# Patient Record
Sex: Male | Born: 1990 | Race: Black or African American | Hispanic: No | Marital: Single | Smoking: Current every day smoker
Health system: Southern US, Community
[De-identification: ages and names within clinical notes are randomized; demographics above are authoritative.]

---

## 2018-02-09 ENCOUNTER — Other Ambulatory Visit: Payer: Self-pay

## 2018-02-09 ENCOUNTER — Encounter (HOSPITAL_COMMUNITY): Payer: Self-pay | Admitting: Emergency Medicine

## 2018-02-09 ENCOUNTER — Ambulatory Visit (HOSPITAL_COMMUNITY)
Admission: EM | Admit: 2018-02-09 | Discharge: 2018-02-09 | Disposition: A | Discharge status: Home / Self Care | Payer: Self-pay | Attending: Internal Medicine | Admitting: Internal Medicine

## 2018-02-09 DIAGNOSIS — H5789 Other specified disorders of eye and adnexa: Secondary | ICD-10-CM

## 2018-02-09 MED ORDER — ERYTHROMYCIN 5 MG/GM OP OINT: TOPICAL_OINTMENT | OPHTHALMIC | 0 refills | Status: DC

## 2018-02-09 MED ORDER — LORATADINE-PSEUDOEPHEDRINE ER 5-120 MG PO TB12: 1.0000 | ORAL_TABLET | Freq: Two times a day (BID) | ORAL | 0 refills | Status: DC

## 2018-02-09 NOTE — ED Provider Notes (Signed)
[facility]    CSN: [id number] Arrival date & time: 02/09/18  1718     History   Chief Complaint Chief Complaint  Patient presents with  . Conjunctivitis    HPI Larry Kelley is a 27 y.o. male.   27 year old male presents with eye irritation and swelling x1 day.  Patient states that he woke up yesterday and his eye was "like this.  Patient is acute in nature.  Condition is made better by nothing.  Condition is made worse by nothing.  Patient denies any treatment prior to arrival at this facility.  Patient describes his eyes itching.  Denies any visual loss, pain or history of allergies.  Patient denies any recent illnesses or trauma to the affected eye.     History reviewed. No pertinent past medical history.  There are no active problems to display for this patient.   History reviewed. No pertinent surgical history.     Home Medications    Prior to Admission medications   Not on File    Family History History reviewed. No pertinent family history.  Social History Social History   Tobacco Use  . Smoking status: Current Every Day Smoker    Packs/day: 0.50    Types: Cigarettes  . Smokeless tobacco: Never Used  Substance Use Topics  . Alcohol use: Yes    Frequency: Never  . Drug use: Never     Allergies   Patient has no known allergies.   Review of Systems Review of Systems  Eyes: Positive for redness. Negative for photophobia, pain and visual disturbance.       Swelling to eye lid     Physical Exam Triage Vital Signs ED Triage Vitals  Enc Vitals Group     BP 02/09/18 1803 131/86     Pulse Rate 02/09/18 1803 82     Resp 02/09/18 1803 16     Temp 02/09/18 1803 99.4 F (37.4 C)     Temp Source 02/09/18 1803 Oral     SpO2 02/09/18 1803 100 %     Weight --      Height --      Head Circumference --      Peak Flow --      Pain Score 02/09/18 1802 0     Pain Loc --      Pain Edu? --      Excl. in GC? --    No data  found.  Updated Vital Signs BP 131/86 (BP Location: Right Arm)   Pulse 82   Temp 99.4 F (37.4 C) (Oral)   Resp 16   SpO2 100%   Visual Acuity Right Eye Distance:   Left Eye Distance:   Bilateral Distance:    Right Eye Near:   Left Eye Near:    Bilateral Near:     Physical Exam  Constitutional: He is oriented to person, place, and time. He appears well-developed and well-nourished.  HENT:  Head: Normocephalic.  Eyes: Pupils are equal, round, and reactive to light.  Erythema noted to right thigh with swelling to upper lid.swelling noted under eye  Neck: Normal range of motion.  Pulmonary/Chest: Effort normal.  Musculoskeletal: Normal range of motion.  Neurological: He is alert and oriented to person, place, and time.  Skin: Skin is dry.  Psychiatric: He has a normal mood and affect.  Nursing note and vitals reviewed.    UC Treatments / Results  Labs (all labs ordered are listed, but only abnormal results  are displayed) Labs Reviewed - No data to display  EKG None  Radiology No results found.  Procedures Procedures (including critical care time)  Medications Ordered in UC Medications - No data to display  Initial Impression / Assessment and Plan / UC Course  I have reviewed the triage vital signs and the nursing notes.  Pertinent labs & imaging results that were available during my care of the patient were reviewed by me and considered in my medical decision making (see chart for details).      Final Clinical Impressions(s) / UC Diagnoses   Final diagnoses:  None   Discharge Instructions   None    ED Prescriptions    None     Controlled Substance Prescriptions [place] Controlled Substance Registry consulted? Not Applicable   Alene Mires, NP 02/09/18 1840

## 2018-02-09 NOTE — Discharge Instructions (Addendum)
If condition has not improved oin the next 48 hours or worsens please return

## 2018-02-09 NOTE — ED Triage Notes (Signed)
The patient presented to the [facility] with a complaint of right eye itching and swelling x 3 days. The patient reported using OTC visine.

## 2018-02-12 ENCOUNTER — Ambulatory Visit (HOSPITAL_COMMUNITY)
Admission: EM | Admit: 2018-02-12 | Discharge: 2018-02-12 | Disposition: A | Discharge status: Home / Self Care | Payer: Self-pay | Attending: Family Medicine | Admitting: Family Medicine

## 2018-02-12 ENCOUNTER — Encounter (HOSPITAL_COMMUNITY): Payer: Self-pay | Admitting: Emergency Medicine

## 2018-02-12 DIAGNOSIS — H1011 Acute atopic conjunctivitis, right eye: Secondary | ICD-10-CM

## 2018-02-12 DIAGNOSIS — H11421 Conjunctival edema, right eye: Secondary | ICD-10-CM

## 2018-02-12 MED ORDER — FLUORESCEIN SODIUM 1 MG OP STRP
ORAL_STRIP | OPHTHALMIC | Status: AC
  Filled 2018-02-12: qty 1

## 2018-02-12 MED ORDER — OLOPATADINE HCL 0.2 % OP SOLN: 1.0000 [drp] | Freq: Once | OPHTHALMIC | 0 refills | Status: AC

## 2018-02-12 MED ORDER — POLYETHYL GLYCOL-PROPYL GLYCOL 0.4-0.3 % OP GEL: 1.0000 "application " | Freq: Four times a day (QID) | OPHTHALMIC | 0 refills | Status: DC

## 2018-02-12 MED ORDER — CETIRIZINE HCL 10 MG PO TABS: 10.0000 mg | ORAL_TABLET | Freq: Every day | ORAL | 0 refills | Status: DC

## 2018-02-12 MED ORDER — TETRACAINE HCL 0.5 % OP SOLN
OPHTHALMIC | Status: AC
  Filled 2018-02-12: qty 4

## 2018-02-12 NOTE — ED Triage Notes (Signed)
PT has right eye redness, swelling, and discomfort. PT was seen here Saturday and given an ointment that has not helped.

## 2018-02-12 NOTE — Discharge Instructions (Addendum)
Use the systane eye gel at night before bed. You can use as needed but it will make your vision blurry. Always do the artificial tear gel last. You can do the allergy eye drops in the morning. Zyrtec daily or benadryl can be helpful to prevent further itching.  Make sure that you are doing the eye scrubs with baby shampoo and warm compresses.  Stop the ointment you have been using.  If your symptoms worsen or continue please follow up with an eye doctor.

## 2018-02-12 NOTE — ED Provider Notes (Signed)
[facility]    CSN: [id number] Arrival date & time: 02/12/18  1228     History   Chief Complaint Chief Complaint  Patient presents with  . Eye Problem    HPI Larry Kelley is a 27 y.o. male.   Patient is a 27 year old male presents for continued worsening right eye swelling and discomfort.  He was seen here 3 days ago and treated with erythromycin ointment.  He has been using this every 4 hours.  His symptoms have only worsened.  He has had some slight blurry vision.  He denies any associated fever, chills, body aches.    ROS per HPI      History reviewed. No pertinent past medical history.  There are no active problems to display for this patient.   History reviewed. No pertinent surgical history.     Home Medications    Prior to Admission medications   Medication Sig Start Date End Date Taking? Authorizing Provider  erythromycin ophthalmic ointment Place a 1/2 inch ribbon of ointment into the lower eyelid. 02/09/18  Yes Alene Mires, NP  cetirizine (ZYRTEC) 10 MG tablet Take 1 tablet (10 mg total) by mouth daily. 02/12/18   Muhammed Teutsch, Gloris Manchester A, NP  loratadine-pseudoephedrine (CLARITIN-D 12 HOUR) 5-120 MG tablet Take 1 tablet by mouth 2 (two) times daily. 02/09/18   Alene Mires, NP  Olopatadine HCl 0.2 % SOLN Apply 1 drop to eye once for 1 dose. 02/12/18 02/12/18  Janace Aris, NP  Polyethyl Glycol-Propyl Glycol (SYSTANE) 0.4-0.3 % GEL ophthalmic gel Place 1 application into both eyes 4 (four) times daily. 02/12/18   Janace Aris, NP    Family History No family history on file.  Social History Social History   Tobacco Use  . Smoking status: Current Every Day Smoker    Packs/day: 0.50    Types: Cigarettes  . Smokeless tobacco: Never Used  Substance Use Topics  . Alcohol use: Yes    Frequency: Never  . Drug use: Never     Allergies   Patient has no known allergies.   Review of Systems Review of Systems   Physical  Exam Triage Vital Signs ED Triage Vitals  Enc Vitals Group     BP 02/12/18 1243 116/76     Pulse Rate 02/12/18 1243 87     Resp 02/12/18 1243 18     Temp 02/12/18 1243 98.6 F (37 C)     Temp Source 02/12/18 1243 Oral     SpO2 02/12/18 1243 97 %     Weight --      Height --      Head Circumference --      Peak Flow --      Pain Score 02/12/18 1247 3     Pain Loc --      Pain Edu? --      Excl. in GC? --    No data found.  Updated Vital Signs BP 116/76 (BP Location: Left Arm)   Pulse 87   Temp 98.6 F (37 C) (Oral)   Resp 18   SpO2 97%   Visual Acuity Right Eye Distance:   Left Eye Distance:   Bilateral Distance:    Right Eye Near: R Near: 20/40 without corrective lens Left Eye Near:  L Near: 20/25 without corrective lens Bilateral Near:  20/25 without corrective lens  Physical Exam  Constitutional: He is oriented to person, place, and time. He appears well-developed and well-nourished.  Very pleasant.  Non toxic or ill appearing.   HENT:  Head: Normocephalic and atraumatic.  Eyes: Pupils are equal, round, and reactive to light. EOM are normal.  Chemosis of the right conjunctiva. Scleral injection. Upper lid swelling with erythema. mildly tender to palpation. Tearing from the eye. Some swelling under the eye.   Mild scleral injection to the left eye.   Neck: Normal range of motion.  Pulmonary/Chest: Effort normal.  Musculoskeletal: Normal range of motion.  Neurological: He is alert and oriented to person, place, and time.  Skin: Skin is warm and dry.  Psychiatric: He has a normal mood and affect.  Nursing note and vitals reviewed.      UC Treatments / Results  Labs (all labs ordered are listed, but only abnormal results are displayed) Labs Reviewed - No data to display  EKG None  Radiology No results found.  Procedures Procedures (including critical care time)  Medications Ordered in UC Medications - No data to display  Initial Impression /  Assessment and Plan / UC Course  I have reviewed the triage vital signs and the nursing notes.  Pertinent labs & imaging results that were available during my care of the patient were reviewed by me and considered in my medical decision making (see chart for details).     Chemosis of the right eye likely due to allergic conjunctivitis.  Told to stop the abx ointment We will start systane  and allergy eye drops(pataday) Zyrtec or benadryl daily  Lid scrubs and warm compresses Contact given for Optho if symptoms persist.    Final Clinical Impressions(s) / UC Diagnoses   Final diagnoses:  Chemosis of right conjunctiva  Allergic conjunctivitis of right eye     Discharge Instructions     Use the systane eye gel at night before bed. You can use as needed but it will make your vision blurry. Always do the artificial tear gel last. You can do the allergy eye drops in the morning. Zyrtec daily or benadryl can be helpful to prevent further itching.  Make sure that you are doing the eye scrubs with baby shampoo and warm compresses.  Stop the ointment you have been using.  If your symptoms worsen or continue please follow up with an eye doctor.       ED Prescriptions    Medication Sig Dispense Auth. Provider   Polyethyl Glycol-Propyl Glycol (SYSTANE) 0.4-0.3 % GEL ophthalmic gel Place 1 application into both eyes 4 (four) times daily. 1 Bottle Janayla Marik A, NP   Olopatadine HCl 0.2 % SOLN Apply 1 drop to eye once for 1 dose. 1 Bottle Alaisa Moffitt A, NP   cetirizine (ZYRTEC) 10 MG tablet Take 1 tablet (10 mg total) by mouth daily. 30 tablet Dahlia Byes A, NP     Controlled Substance Prescriptions [place] Controlled Substance Registry consulted? Not Applicable   Janace Aris, NP 02/12/18 1553

## 2018-11-06 ENCOUNTER — Other Ambulatory Visit: Payer: Self-pay

## 2018-11-06 ENCOUNTER — Ambulatory Visit (HOSPITAL_COMMUNITY)
Admission: EM | Admit: 2018-11-06 | Discharge: 2018-11-06 | Disposition: A | Discharge status: Home / Self Care | Payer: Self-pay | Attending: Family Medicine | Admitting: Family Medicine

## 2018-11-06 ENCOUNTER — Encounter (HOSPITAL_COMMUNITY): Payer: Self-pay

## 2018-11-06 DIAGNOSIS — T148XXA Other injury of unspecified body region, initial encounter: Secondary | ICD-10-CM

## 2018-11-06 MED ORDER — TETANUS-DIPHTH-ACELL PERTUSSIS 5-2.5-18.5 LF-MCG/0.5 IM SUSP
0.5000 mL | Freq: Once | INTRAMUSCULAR | Status: AC
  Administered 2018-11-06: 0.5 mL via INTRAMUSCULAR

## 2018-11-06 MED ORDER — TETANUS-DIPHTH-ACELL PERTUSSIS 5-2.5-18.5 LF-MCG/0.5 IM SUSP
INTRAMUSCULAR | Status: AC
  Filled 2018-11-06: qty 0.5

## 2018-11-06 MED ORDER — CEPHALEXIN 500 MG PO CAPS: 500.0000 mg | ORAL_CAPSULE | Freq: Four times a day (QID) | ORAL | 0 refills | Status: DC

## 2018-11-06 NOTE — Discharge Instructions (Signed)
We updated your tetanus today please keep clean and dry Keep foot elevated and may apply ice to help with any swelling If you start to develop any redness swelling or drainage please take Keflex 4 times daily for 5 days If symptoms are not resolving with use of antibiotic please follow-up in person

## 2018-11-06 NOTE — ED Triage Notes (Signed)
Pt stepped on a rusty nail. ( left foot).  Pt needs Tdap. This happened yesterday.

## 2018-11-06 NOTE — ED Provider Notes (Signed)
[facility]    CSN: [id number] Arrival date & time: 11/06/18  1002      History   Chief Complaint Chief Complaint  Patient presents with  . Foot Pain    HPI Larry Kelley is a 28 y.o. male no significant past medical history presenting today for evaluation of a puncture wound.  Patient stepped on a rusty nail yesterday.  Penetrated his foot slightly after going through flip-flop.  Notes that nail was old and resting.  He denies concern for underlying fracture.  Has some soreness around wound, but otherwise without difficulty ambulating.  Denies fevers.  Tetanus out of date.  Nail was removed from shoe and was fully intact.  HPI  History reviewed. No pertinent past medical history.  There are no active problems to display for this patient.   History reviewed. No pertinent surgical history.     Home Medications    Prior to Admission medications   Medication Sig Start Date End Date Taking? Authorizing Provider  cephALEXin (KEFLEX) 500 MG capsule Take 1 capsule (500 mg total) by mouth 4 (four) times daily. 11/06/18   Wieters, Hallie C, PA-C  cetirizine (ZYRTEC) 10 MG tablet Take 1 tablet (10 mg total) by mouth daily. 02/12/18   Loura Halt A, NP  erythromycin ophthalmic ointment Place a 1/2 inch ribbon of ointment into the lower eyelid. 02/09/18   Jacqualine Mau, NP  loratadine-pseudoephedrine (CLARITIN-D 12 HOUR) 5-120 MG tablet Take 1 tablet by mouth 2 (two) times daily. 02/09/18   Jacqualine Mau, NP  Polyethyl Glycol-Propyl Glycol (SYSTANE) 0.4-0.3 % GEL ophthalmic gel Place 1 application into both eyes 4 (four) times daily. 02/12/18   Orvan July, NP    Family History Family History  Problem Relation Age of Onset  . Healthy Mother   . Healthy Father     Social History Social History   Tobacco Use  . Smoking status: Current Every Day Smoker    Packs/day: 0.50    Types: Cigarettes  . Smokeless tobacco: Never Used  Substance Use Topics   . Alcohol use: Yes    Frequency: Never  . Drug use: Never     Allergies   Patient has no known allergies.   Review of Systems Review of Systems  Constitutional: Negative for fatigue and fever.  Eyes: Negative for redness, itching and visual disturbance.  Respiratory: Negative for shortness of breath.   Cardiovascular: Negative for chest pain and leg swelling.  Gastrointestinal: Negative for nausea and vomiting.  Musculoskeletal: Negative for arthralgias and myalgias.  Skin: Positive for wound. Negative for color change and rash.  Neurological: Negative for dizziness, syncope, weakness, light-headedness and headaches.     Physical Exam Triage Vital Signs ED Triage Vitals  Enc Vitals Group     BP 11/06/18 1036 131/68     Pulse Rate 11/06/18 1036 88     Resp 11/06/18 1036 18     Temp 11/06/18 1036 98.8 F (37.1 C)     Temp src --      SpO2 11/06/18 1036 100 %     Weight 11/06/18 1035 140 lb (63.5 kg)     Height --      Head Circumference --      Peak Flow --      Pain Score 11/06/18 1035 3     Pain Loc --      Pain Edu? --      Excl. in [place]? --    No data  found.  Updated Vital Signs BP 131/68 (BP Location: Right Arm)   Pulse 88   Temp 98.8 F (37.1 C)   Resp 18   Wt 140 lb (63.5 kg)   SpO2 100%   Visual Acuity Right Eye Distance:   Left Eye Distance:   Bilateral Distance:    Right Eye Near:   Left Eye Near:    Bilateral Near:     Physical Exam Vitals signs and nursing note reviewed.  Constitutional:      Appearance: He is well-developed.     Comments: No acute distress  HENT:     Head: Normocephalic and atraumatic.     Nose: Nose normal.  Eyes:     Conjunctiva/sclera: Conjunctivae normal.  Neck:     Musculoskeletal: Neck supple.  Cardiovascular:     Rate and Rhythm: Normal rate.  Pulmonary:     Effort: Pulmonary effort is normal. No respiratory distress.  Abdominal:     General: There is no distension.  Musculoskeletal: Normal range of  motion.  Skin:    General: Skin is warm and dry.     Comments: Plantar surface of left foot near heel with puncture wound, no surrounding erythema or drainage noted, tender to touch directly about wound, but otherwise nontender to palpation  Neurological:     Mental Status: He is alert and oriented to person, place, and time.      UC Treatments / Results  Labs (all labs ordered are listed, but only abnormal results are displayed) Labs Reviewed - No data to display  EKG   Radiology No results found.  Procedures Procedures (including critical care time)  Medications Ordered in UC Medications  Tdap (BOOSTRIX) injection 0.5 mL (has no administration in time range)    Initial Impression / Assessment and Plan / UC Course  I have reviewed the triage vital signs and the nursing notes.  Pertinent labs & imaging results that were available during my care of the patient were reviewed by me and considered in my medical decision making (see chart for details).     We will update tetanus, Keflex if developing signs of infection, keep area clean and dry, deferring imaging for now.  Most likely soft tissue injury.Discussed strict return precautions. Patient verbalized understanding and is agreeable with plan.  Final Clinical Impressions(s) / UC Diagnoses   Final diagnoses:  Puncture wound     Discharge Instructions     We updated your tetanus today please keep clean and dry Keep foot elevated and may apply ice to help with any swelling If you start to develop any redness swelling or drainage please take Keflex 4 times daily for 5 days If symptoms are not resolving with use of antibiotic please follow-up in person   ED Prescriptions    Medication Sig Dispense Auth. Provider   cephALEXin (KEFLEX) 500 MG capsule Take 1 capsule (500 mg total) by mouth 4 (four) times daily. 20 capsule Wieters, Hallie C, PA-C     Controlled Substance Prescriptions [place] Controlled Substance  Registry consulted? Not Applicable   Lew DawesWieters, Hallie C, [place]PA-C 11/06/18 1058

## 2019-01-27 ENCOUNTER — Other Ambulatory Visit: Payer: Self-pay

## 2019-01-27 DIAGNOSIS — Z20822 Contact with and (suspected) exposure to covid-19: Secondary | ICD-10-CM

## 2019-01-29 LAB — NOVEL CORONAVIRUS, NAA: SARS-CoV-2, NAA: NOT DETECTED

## 2019-12-15 ENCOUNTER — Ambulatory Visit (HOSPITAL_COMMUNITY)
Admission: EM | Admit: 2019-12-15 | Discharge: 2019-12-15 | Disposition: A | Discharge status: Home / Self Care | Payer: Self-pay | Attending: Family Medicine | Admitting: Family Medicine

## 2019-12-15 ENCOUNTER — Other Ambulatory Visit: Payer: Self-pay

## 2019-12-15 ENCOUNTER — Ambulatory Visit (INDEPENDENT_AMBULATORY_CARE_PROVIDER_SITE_OTHER): Payer: Self-pay

## 2019-12-15 ENCOUNTER — Encounter (HOSPITAL_COMMUNITY): Payer: Self-pay

## 2019-12-15 DIAGNOSIS — M25642 Stiffness of left hand, not elsewhere classified: Secondary | ICD-10-CM

## 2019-12-15 DIAGNOSIS — S6992XA Unspecified injury of left wrist, hand and finger(s), initial encounter: Secondary | ICD-10-CM

## 2019-12-15 DIAGNOSIS — W19XXXA Unspecified fall, initial encounter: Secondary | ICD-10-CM

## 2019-12-15 DIAGNOSIS — Y92009 Unspecified place in unspecified non-institutional (private) residence as the place of occurrence of the external cause: Secondary | ICD-10-CM

## 2019-12-15 MED ORDER — HYDROCODONE-ACETAMINOPHEN 5-325 MG PO TABS: 1.0000 | ORAL_TABLET | Freq: Four times a day (QID) | ORAL | 0 refills | Status: AC | PRN

## 2019-12-15 MED ORDER — IBUPROFEN 800 MG PO TABS: 800.0000 mg | ORAL_TABLET | Freq: Three times a day (TID) | ORAL | 0 refills | Status: AC

## 2019-12-15 NOTE — Discharge Instructions (Addendum)
Wear splint as needed for the next few days until pain and swelling go down Elevate hand Wash wounds daily and apply antibiotic ointment Expect improvement over the next few days See orthopedic if you fail to improve Take ibuprofen 3 times a day with food Take hydrocodone if the ibuprofen is not strong enough.  Do not drive on hydrocodone

## 2019-12-15 NOTE — ED Provider Notes (Signed)
[facility]    CSN: [id number] Arrival date & time: 12/15/19  1555      History   Chief Complaint Chief Complaint  Patient presents with  . Finger Injury    HPI Larry Kelley is a 29 y.o. male.   HPI  Patient states he tripped getting out of the car and fell yesterday.  He landed on his left hand and his face.  He has scrapes on his fingers.  His thumb is very painful.  He states that he also has some abrasions on his face.  He is certain that his tetanus is up-to-date.  He states he is otherwise in good health.  History reviewed. No pertinent past medical history.  There are no problems to display for this patient.   History reviewed. No pertinent surgical history.     Home Medications    Prior to Admission medications   Medication Sig Start Date End Date Taking? Authorizing Provider  HYDROcodone-acetaminophen (NORCO/VICODIN) 5-325 MG tablet Take 1-2 tablets by mouth every 6 (six) hours as needed. 12/15/19   Eustace Moore, MD  ibuprofen (ADVIL) 800 MG tablet Take 1 tablet (800 mg total) by mouth 3 (three) times daily. 12/15/19   Eustace Moore, MD  cetirizine (ZYRTEC) 10 MG tablet Take 1 tablet (10 mg total) by mouth daily. 02/12/18 12/15/19  Janace Aris, NP    Family History Family History  Problem Relation Age of Onset  . Healthy Mother   . Healthy Father     Social History Social History   Tobacco Use  . Smoking status: Current Every Day Smoker    Packs/day: 0.50    Types: Cigarettes  . Smokeless tobacco: Never Used  Vaping Use  . Vaping Use: Never used  Substance Use Topics  . Alcohol use: Yes    Alcohol/week: 5.0 standard drinks    Types: 2 Cans of beer, 3 Shots of liquor per week  . Drug use: Never     Allergies   Patient has no known allergies.   Review of Systems Review of Systems See HPI  Physical Exam Triage Vital Signs ED Triage Vitals  Enc Vitals Group     BP 12/15/19 1757 133/88     Pulse Rate 12/15/19  1757 84     Resp 12/15/19 1757 16     Temp 12/15/19 1757 98.6 F (37 C)     Temp Source 12/15/19 1757 Oral     SpO2 12/15/19 1757 100 %     Weight 12/15/19 1758 140 lb (63.5 kg)     Height 12/15/19 1758 5\' 8"  (1.727 m)     Head Circumference --      Peak Flow --      Pain Score 12/15/19 1758 6     Pain Loc --      Pain Edu? --      Excl. in GC? --    No data found.  Updated Vital Signs BP 133/88   Pulse 84   Temp 98.6 F (37 C) (Oral)   Resp 16   Ht 5\' 8"  (1.727 m)   Wt 63.5 kg   SpO2 100%   BMI 21.29 kg/m   Physical Exam Constitutional:      General: He is not in acute distress.    Appearance: He is well-developed and normal weight.  HENT:     Head: Normocephalic and atraumatic.     Mouth/Throat:     Comments: Mask is in place Eyes:  Conjunctiva/sclera: Conjunctivae normal.     Pupils: Pupils are equal, round, and reactive to light.  Cardiovascular:     Rate and Rhythm: Normal rate.  Pulmonary:     Effort: Pulmonary effort is normal. No respiratory distress.  Abdominal:     General: There is no distension.     Palpations: Abdomen is soft.  Musculoskeletal:        General: Normal range of motion.     Cervical back: Normal range of motion.     Comments: Left hand has abrasions on the dorsum of the thumb long and ring fingers over the DIP region.  Superficial.  Thumb has a small subungual hematoma.  The pad of the thumb has swelling and ecchymosis as well.  There is tenderness around the DIP.  He can partially flex and extend.  He has normal sensory exam  Skin:    General: Skin is warm and dry.  Neurological:     Mental Status: He is alert.  Psychiatric:        Behavior: Behavior normal.      UC Treatments / Results  Labs (all labs ordered are listed, but only abnormal results are displayed) Labs Reviewed - No data to display  EKG   Radiology DG Finger Thumb Left  Result Date: 12/15/2019 CLINICAL DATA:  Decreased range of motion EXAM: LEFT  THUMB 2+V COMPARISON:  None. FINDINGS: There is no evidence of fracture or dislocation. There is no evidence of arthropathy or other focal bone abnormality. Soft tissue swelling is present. IMPRESSION: No acute osseous abnormality Electronically Signed   By: Jasmine Pang M.D.   On: 12/15/2019 18:28    Procedures Procedures (including critical care time)  Medications Ordered in UC Medications - No data to display  Initial Impression / Assessment and Plan / UC Course  I have reviewed the triage vital signs and the nursing notes.  Pertinent labs & imaging results that were available during my care of the patient were reviewed by me and considered in my medical decision making (see chart for details).     Reviewed normal x-ray.  Conservative management.  Appropriate follow-up Final Clinical Impressions(s) / UC Diagnoses   Final diagnoses:  Thumb injury, left, initial encounter  Fall in home, initial encounter     Discharge Instructions     Wear splint as needed for the next few days until pain and swelling go down Elevate hand Wash wounds daily and apply antibiotic ointment Expect improvement over the next few days See orthopedic if you fail to improve Take ibuprofen 3 times a day with food Take hydrocodone if the ibuprofen is not strong enough.  Do not drive on hydrocodone   ED Prescriptions    Medication Sig Dispense Auth. Provider   ibuprofen (ADVIL) 800 MG tablet Take 1 tablet (800 mg total) by mouth 3 (three) times daily. 21 tablet Eustace Moore, MD   HYDROcodone-acetaminophen (NORCO/VICODIN) 5-325 MG tablet Take 1-2 tablets by mouth every 6 (six) hours as needed. 10 tablet Eustace Moore, MD     I have reviewed the PDMP during this encounter.   Eustace Moore, MD 12/15/19 [phone]

## 2019-12-15 NOTE — ED Triage Notes (Signed)
Pt states he fell getting out of a car and injured his left 1st digit of hand last night. PT has a ecchymosis under the bed of the nail, pt has blood blister on anterior of finger. Pt unable to bend finger.

## 2020-09-17 ENCOUNTER — Encounter (HOSPITAL_COMMUNITY): Payer: Self-pay

## 2020-09-17 ENCOUNTER — Ambulatory Visit (HOSPITAL_COMMUNITY)
Admission: EM | Admit: 2020-09-17 | Discharge: 2020-09-17 | Disposition: A | Discharge status: Home / Self Care | Payer: Self-pay | Attending: Emergency Medicine | Admitting: Emergency Medicine

## 2020-09-17 ENCOUNTER — Other Ambulatory Visit: Payer: Self-pay

## 2020-09-17 DIAGNOSIS — J069 Acute upper respiratory infection, unspecified: Secondary | ICD-10-CM | POA: Insufficient documentation

## 2020-09-17 DIAGNOSIS — Z79899 Other long term (current) drug therapy: Secondary | ICD-10-CM | POA: Insufficient documentation

## 2020-09-17 DIAGNOSIS — Z20822 Contact with and (suspected) exposure to covid-19: Secondary | ICD-10-CM | POA: Insufficient documentation

## 2020-09-17 DIAGNOSIS — F17210 Nicotine dependence, cigarettes, uncomplicated: Secondary | ICD-10-CM | POA: Insufficient documentation

## 2020-09-17 DIAGNOSIS — R197 Diarrhea, unspecified: Secondary | ICD-10-CM | POA: Insufficient documentation

## 2020-09-17 LAB — SARS CORONAVIRUS 2 (TAT 6-24 HRS): SARS Coronavirus 2: NEGATIVE

## 2020-09-17 MED ORDER — ALBUTEROL SULFATE HFA 108 (90 BASE) MCG/ACT IN AERS: 1.0000 | INHALATION_SPRAY | Freq: Four times a day (QID) | RESPIRATORY_TRACT | 0 refills | Status: AC | PRN

## 2020-09-17 NOTE — ED Triage Notes (Signed)
Pt c/o congestion, headache and diarrhea x 2 days. Pt states he took DayQuil yesterday and states it relieved the congestion a little.

## 2020-09-17 NOTE — Discharge Instructions (Addendum)
Self isolate until covid results are back.  We will notify you by phone if it is positive. Your negative results will be sent through your MyChart.    If it is positive you need to isolate from others for a total of 5 days. If no fever for 24 hours without medications, and symptoms improving you may end isolation on day 6, but wear a mask if around any others for an additional 5 days.   Push fluids to ensure adequate hydration and keep secretions thin.  Bland diet as tolerated.  Tylenol and/or ibuprofen as needed for pain or fevers.  Over the counter medications as needed for symptoms.  Use of inhaler as needed for wheezing or shortness of breath.   If symptoms worsen or do not improve in the next week to return to be seen or to follow up with your PCP.

## 2020-09-17 NOTE — ED Provider Notes (Signed)
[facility]    CSN: [id number] Arrival date & time: 09/17/20  0841      History   Chief Complaint Chief Complaint  Patient presents with  . Nasal Congestion  . Diarrhea  . Headache    HPI Larry Kelley is a 30 y.o. male.   Larry Kelley presents with complaints of cough congestion and headache which started two days ago. Yesterday with one episode of emesis and diarrhea, none since. No known fevers. No ear or throat pain. No abdominal pain. No shortness of breath .no chest pain. Son with similar URI. Hasn't had covid in the past 30 days. Has taking dayquil which has helped some. He does smoke. Denies any chest tightness or wheezing currently.     ROS per HPI, negative if not otherwise mentioned.      History reviewed. No pertinent past medical history.  There are no problems to display for this patient.   History reviewed. No pertinent surgical history.     Home Medications    Prior to Admission medications   Medication Sig Start Date End Date Taking? Authorizing Provider  albuterol (PROAIR HFA) 108 (90 Base) MCG/ACT inhaler Inhale 1-2 puffs into the lungs every 6 (six) hours as needed for wheezing or shortness of breath. 09/17/20  Yes Cinnamon Morency, Dorene Grebe B, NP  HYDROcodone-acetaminophen (NORCO/VICODIN) 5-325 MG tablet Take 1-2 tablets by mouth every 6 (six) hours as needed. 12/15/19   Eustace Moore, MD  ibuprofen (ADVIL) 800 MG tablet Take 1 tablet (800 mg total) by mouth 3 (three) times daily. 12/15/19   Eustace Moore, MD  cetirizine (ZYRTEC) 10 MG tablet Take 1 tablet (10 mg total) by mouth daily. 02/12/18 12/15/19  Janace Aris, NP    Family History Family History  Problem Relation Age of Onset  . Healthy Mother   . Healthy Father     Social History Social History   Tobacco Use  . Smoking status: Current Every Day Smoker    Packs/day: 0.50    Types: Cigarettes  . Smokeless tobacco: Never Used  Vaping Use  . Vaping Use: Never used   Substance Use Topics  . Alcohol use: Yes    Alcohol/week: 5.0 standard drinks    Types: 2 Cans of beer, 3 Shots of liquor per week  . Drug use: Never     Allergies   Patient has no known allergies.   Review of Systems Review of Systems   Physical Exam Triage Vital Signs ED Triage Vitals  Enc Vitals Group     BP 09/17/20 0849 119/83     Pulse Rate 09/17/20 0849 89     Resp 09/17/20 0849 19     Temp 09/17/20 0849 98.6 F (37 C)     Temp Source 09/17/20 0849 Oral     SpO2 09/17/20 0849 98 %     Weight --      Height --      Head Circumference --      Peak Flow --      Pain Score 09/17/20 0848 0     Pain Loc --      Pain Edu? --      Excl. in GC? --    No data found.  Updated Vital Signs BP 119/83 (BP Location: Right Arm)   Pulse 89   Temp 98.6 F (37 C) (Oral)   Resp 19   SpO2 98%   Visual Acuity Right Eye Distance:   Left Eye Distance:  Bilateral Distance:    Right Eye Near:   Left Eye Near:    Bilateral Near:     Physical Exam Constitutional:      Appearance: He is well-developed.  Cardiovascular:     Rate and Rhythm: Normal rate.  Pulmonary:     Effort: Pulmonary effort is normal.  Skin:    General: Skin is warm and dry.  Neurological:     Mental Status: He is alert and oriented to person, place, and time.      UC Treatments / Results  Labs (all labs ordered are listed, but only abnormal results are displayed) Labs Reviewed  SARS CORONAVIRUS 2 (TAT 6-24 HRS)    EKG   Radiology No results found.  Procedures Procedures (including critical care time)  Medications Ordered in UC Medications - No data to display  Initial Impression / Assessment and Plan / UC Course  I have reviewed the triage vital signs and the nursing notes.  Pertinent labs & imaging results that were available during my care of the patient were reviewed by me and considered in my medical decision making (see chart for details).     Non toxic. Benign  physical exam.  History and physical consistent with viral illness.  Covid testing pending and isolation instructions provided.  Supportive cares recommended. Return precautions provided. Patient verbalized understanding and agreeable to plan.   Final Clinical Impressions(s) / UC Diagnoses   Final diagnoses:  Upper respiratory tract infection, unspecified type     Discharge Instructions     Self isolate until covid results are back.  We will notify you by phone if it is positive. Your negative results will be sent through your MyChart.    If it is positive you need to isolate from others for a total of 5 days. If no fever for 24 hours without medications, and symptoms improving you may end isolation on day 6, but wear a mask if around any others for an additional 5 days.   Push fluids to ensure adequate hydration and keep secretions thin.  Bland diet as tolerated.  Tylenol and/or ibuprofen as needed for pain or fevers.  Over the counter medications as needed for symptoms.  Use of inhaler as needed for wheezing or shortness of breath.   If symptoms worsen or do not improve in the next week to return to be seen or to follow up with your PCP.       ED Prescriptions    Medication Sig Dispense Auth. Provider   albuterol (PROAIR HFA) 108 (90 Base) MCG/ACT inhaler Inhale 1-2 puffs into the lungs every 6 (six) hours as needed for wheezing or shortness of breath. 1 each Georgetta Haber, NP     PDMP not reviewed this encounter.   Georgetta Haber, NP 09/17/20 [phone]

## 2021-04-18 IMAGING — DX DG FINGER THUMB 2+V*L*
3 series · 3 of 3 positions shown · non-contrast
Comparison: None.

CLINICAL DATA: Decreased range of motion

EXAM:
LEFT THUMB 2+V

[finger ap]
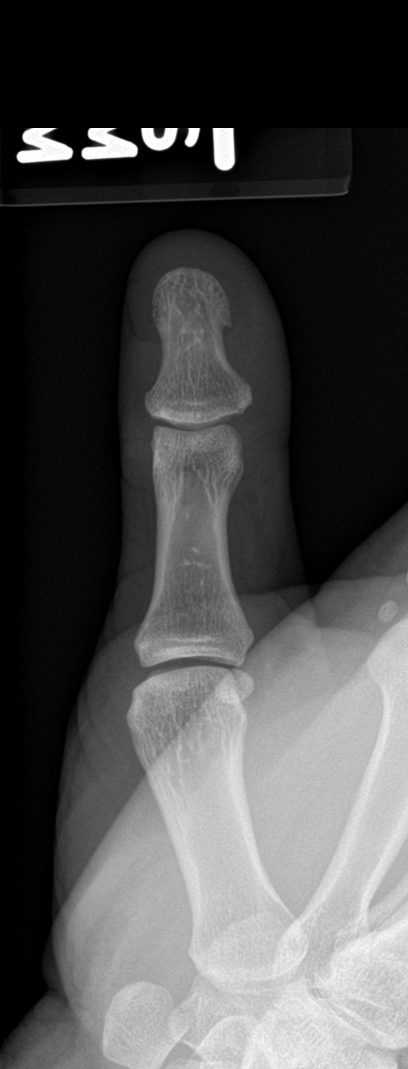

[finger obl]
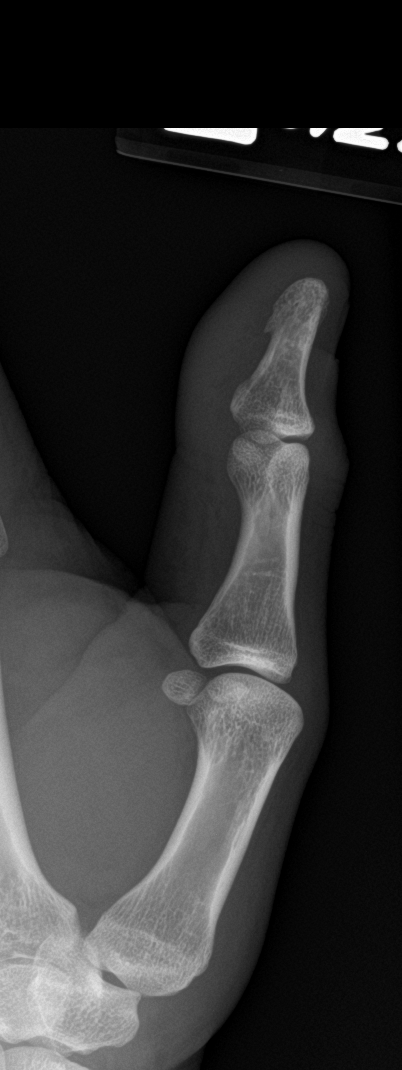

[finger lat]
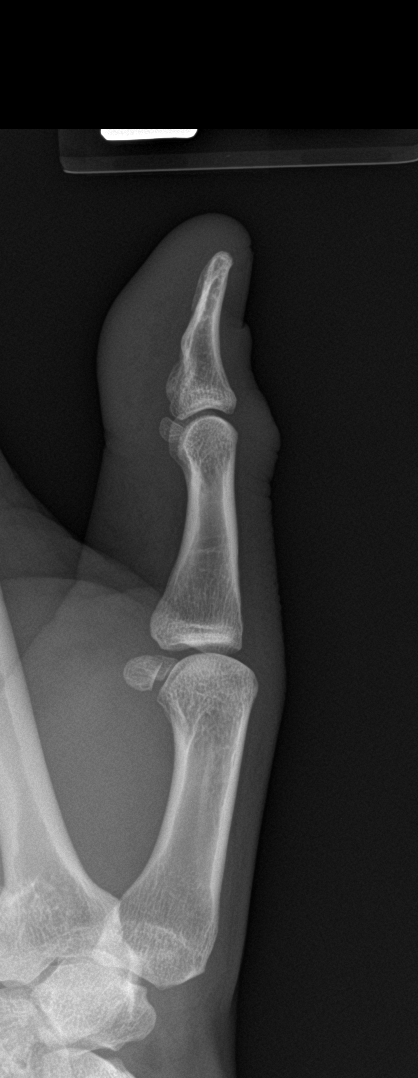

[3 of 3 positions shown; findings below may reference images not displayed]

FINDINGS: There is no evidence of fracture or dislocation. There is no
evidence of arthropathy or other focal bone abnormality. Soft tissue
swelling is present.
IMPRESSION: No acute osseous abnormality

## 2023-04-15 ENCOUNTER — Encounter (HOSPITAL_BASED_OUTPATIENT_CLINIC_OR_DEPARTMENT_OTHER): Payer: Self-pay | Admitting: Emergency Medicine

## 2023-04-15 ENCOUNTER — Emergency Department (HOSPITAL_BASED_OUTPATIENT_CLINIC_OR_DEPARTMENT_OTHER)
Admission: EM | Admit: 2023-04-15 | Discharge: 2023-04-15 | Disposition: A | Discharge status: Home / Self Care | Payer: Self-pay | Attending: Emergency Medicine | Admitting: Emergency Medicine

## 2023-04-15 DIAGNOSIS — W108XXA Fall (on) (from) other stairs and steps, initial encounter: Secondary | ICD-10-CM | POA: Insufficient documentation

## 2023-04-15 DIAGNOSIS — S01511A Laceration without foreign body of lip, initial encounter: Secondary | ICD-10-CM | POA: Insufficient documentation

## 2023-04-15 MED ORDER — AMOXICILLIN-POT CLAVULANATE 875-125 MG PO TABS: 1.0000 | ORAL_TABLET | Freq: Two times a day (BID) | ORAL | 0 refills | Status: AC

## 2023-04-15 MED ORDER — AMOXICILLIN-POT CLAVULANATE 875-125 MG PO TABS
1.0000 | ORAL_TABLET | Freq: Once | ORAL | Status: AC
  Administered 2023-04-15: 1 via ORAL
  Filled 2023-04-15: qty 1

## 2023-04-15 NOTE — ED Provider Notes (Signed)
[place] EMERGENCY DEPARTMENT AT [facility] Provider Note   CSN: [id number] Arrival date & time: 04/15/23  1338     History  Chief Complaint  Patient presents with   Facial Laceration    Larry Kelley is a 32 y.o. male presents today after tripping up his stairs last night and biting through his upper lip.  This event occurred around midnight.  Patient endorses mild pain and swelling.  Patient denies any other injury or loose or broken teeth.  Patient states that his last Tdap is within 5 years.  HPI     Home Medications Prior to Admission medications   Medication Sig Start Date End Date Taking? Authorizing Provider  amoxicillin-clavulanate (AUGMENTIN) 875-125 MG tablet Take 1 tablet by mouth every 12 (twelve) hours. 04/15/23  Yes Dolphus Jenny, PA-C  albuterol [facility] HFA) 108 (90 Base) MCG/ACT inhaler Inhale 1-2 puffs into the lungs every 6 (six) hours as needed for wheezing or shortness of breath. 09/17/20   Georgetta Haber, NP  HYDROcodone-acetaminophen (NORCO/VICODIN) 5-325 MG tablet Take 1-2 tablets by mouth every 6 (six) hours as needed. 12/15/19   Eustace Moore, MD  ibuprofen (ADVIL) 800 MG tablet Take 1 tablet (800 mg total) by mouth 3 (three) times daily. 12/15/19   Eustace Moore, MD  cetirizine (ZYRTEC) 10 MG tablet Take 1 tablet (10 mg total) by mouth daily. 02/12/18 12/15/19  Janace Aris, NP      Allergies    Patient has no known allergies.    Review of Systems   Review of Systems  Skin:  Positive for wound.    Physical Exam Updated Vital Signs BP (!) 124/96 (BP Location: Right Arm)   Pulse (!) 108   Temp 98.4 F (36.9 C)   Resp 12   SpO2 100%  Physical Exam Vitals and nursing note reviewed.  Constitutional:      General: He is not in acute distress.    Appearance: He is well-developed.  HENT:     Head: Normocephalic and atraumatic.     Right Ear: External ear normal.     Left Ear: External ear normal.     Nose: Nose normal.      Mouth/Throat:     Mouth: Mucous membranes are moist.  Eyes:     Extraocular Movements: Extraocular movements intact.     Conjunctiva/sclera: Conjunctivae normal.  Cardiovascular:     Rate and Rhythm: Normal rate and regular rhythm.     Heart sounds: No murmur heard. Pulmonary:     Effort: Pulmonary effort is normal. No respiratory distress.     Breath sounds: Normal breath sounds.  Abdominal:     Palpations: Abdomen is soft.     Tenderness: There is no abdominal tenderness.  Musculoskeletal:        General: No swelling.     Cervical back: Neck supple.  Skin:    General: Skin is warm and dry.     Capillary Refill: Capillary refill takes less than 2 seconds.     Comments: Patient has approximately 1 cm laceration to outer right upper lip.  Patient also has approximately half centimeter laceration on inner upper right lip.  Wound is hemostatic.  Patient is neurovascularly intact.  Neurological:     General: No focal deficit present.     Mental Status: He is alert.     Motor: No weakness.  Psychiatric:        Mood and Affect: Mood normal.  ED Results / Procedures / Treatments   Labs (all labs ordered are listed, but only abnormal results are displayed) Labs Reviewed - No data to display  EKG None  Radiology No results found.  Procedures Procedures    Medications Ordered in ED Medications  amoxicillin-clavulanate (AUGMENTIN) 875-125 MG per tablet 1 tablet (has no administration in time range)    ED Course/ Medical Decision Making/ A&P                                 Medical Decision Making  This patient presents to the ED with chief complaint(s) of laceration with pertinent past medical history of none which further complicates the presenting complaint. The complaint involves an extensive differential diagnosis and also carries with it a high risk of complications and morbidity.    The differential diagnosis includes laceration   ED Course and  Reassessment: Discussed primary closure with patient versus secondary intention and that opting for secondary intention would likely lead to a larger scar.  Patient expressed that he would prefer to avoid getting stitches and just wants antibiotics to avoid infection.  Wound was thoroughly irrigated and outer lip covered with bacitracin and nonadhesive bandage.   Consultation: - Consulted or discussed management/test interpretation w/ external professional: None  Consideration for admission or further workup: Considered for admission or further workup however patient's physical exam and vital signs were reassuring.  Patient opted for secondary intention versus primary closure with sutures.  Patient given outpatient antibiotic course for infection prevention.  Patient given strict return precautions.        Final Clinical Impression(s) / ED Diagnoses Final diagnoses:  Lip laceration, initial encounter    Rx / DC Orders ED Discharge Orders          Ordered    amoxicillin-clavulanate (AUGMENTIN) 875-125 MG tablet  Every 12 hours        04/15/23 1528              Dolphus Jenny, PA-C 04/15/23 1530    Estelle June A, DO 04/23/23 1108

## 2023-04-15 NOTE — Discharge Instructions (Addendum)
Today you were seen for a mouth laceration.  Please see the attached care instructions.  Please pick up your medication and take as prescribed.  You may alternate taking Tylenol and Motrin as needed for pain.  Please do not take Motrin for greater than 5 days in a row as this may cause rebound headaches.  Thank you for letting us treat you today. After performing a physical exam, I feel you are safe to go home. Please follow up with your PCP in the next several days and provide them with your records from this visit. Return to the Emergency Room if pain becomes severe or symptoms worsen.

## 2023-04-15 NOTE — ED Triage Notes (Signed)
Larey Seat going up stairs,  Tooth through upper lip No loc Happened midnight Tetanus UTD per patient
